# Patient Record
Sex: Female | Born: 1985 | Race: White | Hispanic: No | Marital: Married | Smoking: Never smoker
Health system: Southern US, Community
[De-identification: ages and names within clinical notes are randomized; demographics above are authoritative.]

## PROBLEM LIST (undated history)

## (undated) DIAGNOSIS — N83209 Unspecified ovarian cyst, unspecified side: Secondary | ICD-10-CM

## (undated) DIAGNOSIS — J189 Pneumonia, unspecified organism: Secondary | ICD-10-CM

## (undated) DIAGNOSIS — Q433 Congenital malformations of intestinal fixation: Secondary | ICD-10-CM

## (undated) DIAGNOSIS — IMO0002 Reserved for concepts with insufficient information to code with codable children: Secondary | ICD-10-CM

## (undated) DIAGNOSIS — N189 Chronic kidney disease, unspecified: Secondary | ICD-10-CM

## (undated) DIAGNOSIS — R000 Tachycardia, unspecified: Secondary | ICD-10-CM

## (undated) DIAGNOSIS — E039 Hypothyroidism, unspecified: Secondary | ICD-10-CM

## (undated) DIAGNOSIS — N390 Urinary tract infection, site not specified: Secondary | ICD-10-CM

## (undated) DIAGNOSIS — I499 Cardiac arrhythmia, unspecified: Secondary | ICD-10-CM

## (undated) DIAGNOSIS — R51 Headache: Secondary | ICD-10-CM

## (undated) HISTORY — PX: APPENDECTOMY: SHX54

---
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
## 2007-09-09 HISTORY — PX: LITHOTRIPSY: SUR834

## 2009-09-08 HISTORY — PX: OTHER SURGICAL HISTORY: SHX169

## 2011-09-03 ENCOUNTER — Inpatient Hospital Stay (HOSPITAL_COMMUNITY)
Admission: AD | Admit: 2011-09-03 | Discharge: 2011-09-03 | Disposition: A | Discharge status: Home / Self Care | Source: Ambulatory Visit | Attending: Family Medicine | Admitting: Family Medicine

## 2011-09-03 ENCOUNTER — Encounter (HOSPITAL_COMMUNITY): Payer: Self-pay | Admitting: *Deleted

## 2011-09-03 DIAGNOSIS — J069 Acute upper respiratory infection, unspecified: Secondary | ICD-10-CM

## 2011-09-03 DIAGNOSIS — O99891 Other specified diseases and conditions complicating pregnancy: Secondary | ICD-10-CM | POA: Insufficient documentation

## 2011-09-03 HISTORY — DX: Pneumonia, unspecified organism: J18.9

## 2011-09-03 HISTORY — DX: Tachycardia, unspecified: R00.0

## 2011-09-03 HISTORY — DX: Urinary tract infection, site not specified: N39.0

## 2011-09-03 HISTORY — DX: Reserved for concepts with insufficient information to code with codable children: IMO0002

## 2011-09-03 HISTORY — DX: Chronic kidney disease, unspecified: N18.9

## 2011-09-03 HISTORY — DX: Cardiac arrhythmia, unspecified: I49.9

## 2011-09-03 HISTORY — DX: Headache: R51

## 2011-09-03 HISTORY — DX: Hypothyroidism, unspecified: E03.9

## 2011-09-03 HISTORY — DX: Congenital malformations of intestinal fixation: Q43.3

## 2011-09-03 HISTORY — DX: Unspecified ovarian cyst, unspecified side: N83.209

## 2011-09-03 NOTE — ED Provider Notes (Signed)
Agree with above 

## 2011-09-03 NOTE — ED Provider Notes (Signed)
History     Chief Complaint  Patient presents with  . Cough   HPI HPI Lauren Dunlap 25 y.o. female  G2P0010 at [redacted]w[redacted]d presenting with congestion. Patient lives in [place] but was here for Christmas.   Patient reports ear fullness, sinus congestion, rhinorrhea x 2 days. This morning she noted a new onset of cough and she said she had some burning in her throat and chest with it. No fevers/chills. Slight body aches. No shortness of breath.  Of note, patient hospitalized in September for pneumonia x 1 week with high fevers to 104. She is very concerned about this developing into a pneumonia so wanted to be seen quickly.   Patient denies contractions/decreased fetal movement/discharge/blood from vagina/rush of fluid.   OB History    Grav Para Term Preterm Abortions TAB SAB Ect Mult Living   2    1  1    0      Past Medical History  Diagnosis Date  . Asthma   . Chronic kidney disease     kidney stones  . Malrotation of intestine   . Headache     migraines  . Dysrhythmia   . Tachycardia   . Hypothyroid   . Urinary tract infection   . Pneumonia fall 2012  . Abnormal Pap smear   . Ovarian cyst    Past Surgical History  Procedure Date  . Malrotation of intestine 2011    surgical repair.  Marland Kitchen Appendectomy   . Lithotripsy 2009    Family History  Problem Relation Age of Onset  . Diabetes Maternal Grandmother   . Diabetes Maternal Grandfather   . Anesthesia problems Neg Hx   Thyroiditis in mother  Social history: lives with husband in [place].   History  Substance Use Topics  . Smoking status: Not on file  . Smokeless tobacco: Not on file  . Alcohol Use: Not on file    Allergies:  Allergies  Allergen Reactions  . Codeine Nausea And Vomiting  . Biaxin Rash  . Ceclor (Cefaclor) Rash  . Cefzil Rash  . Erythromycin Rash  . Kiwi Extract Rash    Prescriptions prior to admission  Medication Sig Dispense Refill  . Ascorbic Acid (VITAMIN C) 100 MG tablet Take 100  mg by mouth daily.        . Prenatal Vit-Fe Fumarate-FA (PRENATAL MULTIVITAMIN) TABS Take 1 tablet by mouth daily.        . Pseudoephedrine HCl (SUDAFED 12 HOUR PO) Take 1 tablet by mouth daily.        Marland Kitchen senna (SENOKOT) 8.6 MG tablet Take 1 tablet by mouth daily.        . sodium chloride (OCEAN) 0.65 % nasal spray Place 1 spray into the nose as needed.          ROS negative except as noted in HPI   Physical Exam   Blood pressure 115/58, pulse 110, temperature 98.9 F (37.2 C), resp. rate 18, height 5\' 3"  (1.6 m), weight 79.833 kg (176 lb), SpO2 99.00%.  Physical Exam  HENT:  Right Ear: Tympanic membrane and ear canal normal.  Left Ear: Tympanic membrane and ear canal normal.  Mouth/Throat: Oropharynx is clear and moist and mucous membranes are normal.  Cardiovascular: Regular rhythm.  Exam reveals no gallop and no friction rub.   No murmur heard.      Slightly tachycardic  Respiratory: Effort normal and breath sounds normal. No respiratory distress. She has no wheezes. She has  no rales. She exhibits no tenderness.       No tactile fremitus. No significant percussion findings. No use of accessory muscles.   GI:       Gravid. Size consistent with dates.    FHT -120 baseline. Reactive with 15x15 accels. No decels. Moderate variability  MAU Course  Procedures  MDM o2 sats 99%, no distress/sob/increased work of breathing/fever. Do not suspect pneumonia.   Assessment and Plan  #1 G2P0010 at [redacted]w[redacted]d #2 Viral URI #3 reassuring FHT  WIll discharge home. Encourage PO hydration and rest. Told patient she can continue taking OTC cold medicines that she discussed with her OB at home. She has follow up with her OB tomorrow. Told patient to return if any fever or shortness of breath or any other sings of worsening of her current symptoms.   Case discussed with Wynelle Bourgeois, CNM  HUNTER, STEPHEN 09/03/2011, 7:31 AM

## 2011-09-03 NOTE — Progress Notes (Signed)
Pt reports cough, congestion, sinus pressure x 2 days, taking sudafed without relief. Today began having pain and congestion in her chest, hospitalized for pneumonia in sept.

## 2014-07-10 ENCOUNTER — Encounter (HOSPITAL_COMMUNITY): Payer: Self-pay | Admitting: *Deleted

## 2015-07-27 ENCOUNTER — Other Ambulatory Visit: Payer: Self-pay | Admitting: Gastroenterology

## 2015-07-27 DIAGNOSIS — R110 Nausea: Secondary | ICD-10-CM

## 2015-08-03 ENCOUNTER — Ambulatory Visit (HOSPITAL_COMMUNITY)
Admission: RE | Admit: 2015-08-03 | Discharge: 2015-08-03 | Disposition: A | Discharge status: Home / Self Care | Source: Ambulatory Visit | Attending: Gastroenterology | Admitting: Gastroenterology

## 2015-08-03 DIAGNOSIS — R110 Nausea: Secondary | ICD-10-CM | POA: Insufficient documentation

## 2015-08-15 ENCOUNTER — Ambulatory Visit (HOSPITAL_COMMUNITY)

## 2015-08-15 ENCOUNTER — Encounter (HOSPITAL_COMMUNITY)

## 2015-08-31 ENCOUNTER — Ambulatory Visit (HOSPITAL_COMMUNITY)

## 2015-09-07 ENCOUNTER — Ambulatory Visit (HOSPITAL_COMMUNITY): Admission: RE | Admit: 2015-09-07 | Source: Ambulatory Visit

## 2015-09-12 ENCOUNTER — Inpatient Hospital Stay (HOSPITAL_COMMUNITY)
Admission: AD | Admit: 2015-09-12 | Discharge: 2015-09-12 | Disposition: A | Discharge status: Home / Self Care | Source: Ambulatory Visit | Attending: Family Medicine | Admitting: Family Medicine

## 2015-09-12 ENCOUNTER — Encounter (HOSPITAL_COMMUNITY): Payer: Self-pay | Admitting: *Deleted

## 2015-09-12 DIAGNOSIS — N189 Chronic kidney disease, unspecified: Secondary | ICD-10-CM | POA: Insufficient documentation

## 2015-09-12 DIAGNOSIS — N6310 Unspecified lump in the right breast, unspecified quadrant: Secondary | ICD-10-CM

## 2015-09-12 DIAGNOSIS — N644 Mastodynia: Secondary | ICD-10-CM

## 2015-09-12 DIAGNOSIS — J45909 Unspecified asthma, uncomplicated: Secondary | ICD-10-CM | POA: Insufficient documentation

## 2015-09-12 DIAGNOSIS — N6011 Diffuse cystic mastopathy of right breast: Secondary | ICD-10-CM

## 2015-09-12 DIAGNOSIS — N61 Mastitis without abscess: Secondary | ICD-10-CM | POA: Insufficient documentation

## 2015-09-12 DIAGNOSIS — E039 Hypothyroidism, unspecified: Secondary | ICD-10-CM | POA: Diagnosis not present

## 2015-09-12 LAB — POCT PREGNANCY, URINE: Preg Test, Ur: NEGATIVE

## 2015-09-12 NOTE — MAU Note (Signed)
Having really bad pain in upper rt breast.- since Sept.  Saw dr, found a lump.  Had US- all was clear.  Was put on rx for ? Mastitis.  Pain came back the beginning of Dec.  Pain has gotten worse.  Can't see OB until Jan 10.  The other night breast felt like it was on fire.  Noted an enlarged lymph node, which itches.  Rt upper breast feels full. Pain is now waking her in the night.  Is still breast feeding. Has not had a mammogram

## 2015-09-12 NOTE — MAU Note (Signed)
Urine sent to lab 

## 2015-09-12 NOTE — Discharge Instructions (Signed)
Fibrocystic Breast Changes °Fibrocystic breast changes occur when breast ducts become blocked, causing painful, fluid-filled lumps (cysts) to form in the breast. This is a common condition that is noncancerous (benign). It occurs when women go through hormonal changes during their menstrual cycle. Fibrocystic breast changes can affect one or both breasts. °CAUSES  °The exact cause of fibrocystic breast changes is not known, but it may be related to the female hormones estrogen and progesterone. Family traits that get passed from parent to child (genetics) may also be a factor in some cases. °SIGNS AND SYMPTOMS  °· Tenderness, mild discomfort, or pain.   °· Swelling.   °· Rope-like feeling when touching the breast.   °· Lumpy breast, one or both sides.   °· Changes in breast size, especially before (larger) and after (smaller) the menstrual period.   °· Green or dark brown nipple discharge (not blood).   °Symptoms are usually worse before menstrual periods start and get better toward the end of the menstrual period.  °DIAGNOSIS  °To make a diagnosis, your health care provider will ask you questions and perform a physical exam of your breasts. The health care provider may recommend other tests that can examine inside your breasts, such as: °· A breast X-ray (mammogram).   °· Ultrasonography.  °· An MRI.   °If something more than fibrocystic breast changes is suspected, your health care provider may take a breast tissue sample (breast biopsy) to examine. °TREATMENT  °Often, treatment is not needed. Your health care provider may recommend over-the-counter pain relievers to help lessen pain or discomfort caused by the fibrocystic breast changes. You may also be asked to change your diet to limit or stop eating foods or drinking beverages that contain caffeine. Foods and beverages that contain caffeine include chocolate, soda, coffee, and tea. Reducing sugar and fat in your diet may also help. Your health care provider  may also recommend: °· Fine needle aspiration to remove fluid from a cyst that is causing pain.   °· Surgery to remove a large, persistent, and tender cyst. °HOME CARE INSTRUCTIONS  °· Examine your breasts after every menstrual period. If you do not have menstrual periods, check your breasts the first day of every month. Feel for changes, such as more tenderness, a new growth, a change in breast size, or a change in a lump that has always been there.   °· Only take over-the-counter or prescription medicine as directed by your health care provider.   °· Wear a well-fitted support or sports bra, especially when exercising.   °· Decrease or avoid caffeine, fat, and sugar in your diet as directed by your health care provider.   °SEEK MEDICAL CARE IF:  °· You have fluid leaking (discharge) from your nipples, especially bloody discharge.   °· You have new lumps or bumps in the breast.   °· Your breast or breasts become enlarged, red, and painful.   °· You have areas of your breast that pucker in.   °· Your nipples appear flat or indented.   °  °This information is not intended to replace advice given to you by your health care provider. Make sure you discuss any questions you have with your health care provider. °  °Document Released: 06/11/2006 Document Revised: 05/16/2015 Document Reviewed: 02/13/2013 °Elsevier Interactive Patient Education ©2016 [employer] ° ° °

## 2015-09-12 NOTE — MAU Provider Note (Signed)
This is a Consulting civil engineer note.   See my complete note on the same date.    Sharen Counter, CNM 6:06 PM    History     CSN: [id number]  Arrival date and time: 09/12/15 1059   First Provider Initiated Contact with Patient 09/12/15 1417      Chief Complaint  Patient presents with  . Breast Pain   HPI  Patient is a 30 year old female who presents with breast pain. Patient has been breastfeeding for the past 17 months. In in Sept/Oct of this year she began weaning her son and developed right breast pain characterized as a dull ache and associated with a lump.  She was assessed by her PCP and dx with mastitis.  She also had an ultrasound done during this time which was non-revealing.  Initially her symptoms subsided, but in December returned with more intense pain. Patient states she made another appointment with her PCP with plans to be seen January 10th, however, over the past few days she has developed burning, itching, and a new lump near her axilla which concerned her prompting her to seek emergent care.  Denies rash, redness, swelling.  She has been able to continue breastfeeding.  OB History    Gravida Para Term Preterm AB TAB SAB Ectopic Multiple Living   3    1  1   2       Past Medical History  Diagnosis Date  . Asthma   . Chronic kidney disease     kidney stones  . Malrotation of intestine   . Headache(784.0)     migraines  . Dysrhythmia   . Tachycardia   . Hypothyroid   . Urinary tract infection   . Pneumonia fall 2012  . Abnormal Pap smear   . Ovarian cyst     Past Surgical History  Procedure Laterality Date  . Malrotation of intestine  2011    surgical repair.  Marland Kitchen Appendectomy    . Lithotripsy  2009    Family History  Problem Relation Age of Onset  . Diabetes Maternal Grandmother   . Diabetes Maternal Grandfather   . Anesthesia problems Neg Hx     Social History  Substance Use Topics  . Smoking status: Never Smoker   . Smokeless tobacco: Never Used   . Alcohol Use: No    Allergies:  Allergies  Allergen Reactions  . Avelox [Moxifloxacin Hcl In Nacl] Anaphylaxis  . Codeine Nausea And Vomiting  . Ketorolac Tromethamine     "made her loopy", heart racing.  Was told to list.  . Morphine And Related     halucination  . Ceclor [Cefaclor] Rash  . Cefprozil Rash  . Clarithromycin Rash  . Erythromycin Rash  . Kiwi Extract Rash    Prescriptions prior to admission  Medication Sig Dispense Refill Last Dose  . Cholecalciferol (VITAMIN D3) 5000 units CAPS Take 1 capsule by mouth daily.   09/11/2015 at Unknown time  . levalbuterol (XOPENEX HFA) 45 MCG/ACT inhaler Inhale 1-2 puffs into the lungs every 4 (four) hours as needed for wheezing.     Marland Kitchen loratadine (CLARITIN) 10 MG tablet Take 10 mg by mouth daily.   09/11/2015 at Unknown time  . omeprazole (PRILOSEC) 20 MG capsule Take 20 mg by mouth daily.   09/11/2015 at Unknown time  . Prenatal Vit-Fe Fumarate-FA (PRENATAL MULTIVITAMIN) TABS Take 1 tablet by mouth daily.     09/11/2015 at Unknown time    Review of Systems  Constitutional: Negative for fever and chills.  Gastrointestinal: Negative for nausea.  Musculoskeletal: Negative for joint pain and neck pain.  Skin: Positive for itching. Negative for rash.   Physical Exam   Blood pressure 111/65, pulse 81, temperature 98.4 F (36.9 C), temperature source Oral, resp. rate 18, weight 72.122 kg (159 lb), last menstrual period 09/25/2014, unknown if currently breastfeeding.  Physical Exam  Constitutional: She appears well-developed and well-nourished.  HENT:  Head: Normocephalic and atraumatic.  Cardiovascular: Normal rate.   Respiratory: Effort normal. No respiratory distress. Right breast exhibits mass (several rope-like, small, mobile) and tenderness. Right breast exhibits no skin change.    MAU Course  Procedures  MDM Probable fibrocystic changes and tenderness felt on breast exam.  Various densities palpated throughout breast and  into tail of spence.  No lymphadenopathy.  Changes are associated with recent change in breastfeeding practice and menstrual cycle.  No rash, erythema, warmth or other indication of infection at this time.   Pregnancy test negative.   Assessment and Plan  Probable fibrocystic changes to right breast. [facility]. Will follow for results.  Encouraged patient to keep January 10th appointment with PCP or return to MAU if concerns continue.  Reassurance provided.   Loyce DysKacie Matthews 09/12/2015, 2:32 PM   I have seen this patient and agree with the above student's note.  See my provider note on same date.  LEFTWICH-KIRBY, Janiya Millirons Certified Nurse-Midwife

## 2015-12-01 ENCOUNTER — Emergency Department (INDEPENDENT_AMBULATORY_CARE_PROVIDER_SITE_OTHER)
Admission: EM | Admit: 2015-12-01 | Discharge: 2015-12-01 | Disposition: A | Discharge status: Home / Self Care | Source: Home / Self Care | Attending: Family Medicine | Admitting: Family Medicine

## 2015-12-01 ENCOUNTER — Emergency Department (INDEPENDENT_AMBULATORY_CARE_PROVIDER_SITE_OTHER)

## 2015-12-01 ENCOUNTER — Encounter: Payer: Self-pay | Admitting: Emergency Medicine

## 2015-12-01 DIAGNOSIS — R0789 Other chest pain: Secondary | ICD-10-CM | POA: Diagnosis not present

## 2015-12-01 DIAGNOSIS — R0602 Shortness of breath: Secondary | ICD-10-CM | POA: Diagnosis not present

## 2015-12-01 DIAGNOSIS — F410 Panic disorder [episodic paroxysmal anxiety] without agoraphobia: Secondary | ICD-10-CM

## 2015-12-01 DIAGNOSIS — J069 Acute upper respiratory infection, unspecified: Secondary | ICD-10-CM | POA: Diagnosis not present

## 2015-12-01 LAB — POCT FASTING CBG KUC MANUAL ENTRY: POCT Glucose (KUC): 90 mg/dL (ref 70–99)

## 2015-12-01 NOTE — ED Notes (Signed)
Pt c/o chest tightness x 3 days, SOB, lightheadness x today, productive cough.  Pt is currently on Augmentin x 4 days now for an URI.

## 2015-12-01 NOTE — ED Provider Notes (Signed)
CSN: [id number]     Arrival date & time 12/01/15  1134 History   First MD Initiated Contact with Patient 12/01/15 1138     Chief Complaint  Patient presents with  . Chest Pain   (Consider location/radiation/quality/duration/timing/severity/associated sxs/prior Treatment) HPI The pt is a 30yo female [facility] with c/o chest tightness for 3 days with associated SOB and lightheadedness that started today with associated productive cough.  Per CareEverywhere, she [facility] for URI, but symptoms were not improving so her PCP called in Augmentin for her to start taking on 11/26/15.  She is on day 4 of 10 day course of the medication but states she "does not feel right" today.  She reports hx of asthma in the past but has not had to use an inhaler in several months.  Pt states she does not like how albuterol makes her feel.  She also reports having prednisone in the past for asthma but states she does not want it unless she has to due to recent weight gain.  Denies fever, chills, n/v/d.    Past Medical History  Diagnosis Date  . Asthma   . Chronic kidney disease     kidney stones  . Malrotation of intestine   . Headache(784.0)     migraines  . Dysrhythmia   . Tachycardia   . Hypothyroid   . Urinary tract infection   . Pneumonia fall 2012  . Abnormal Pap smear   . Ovarian cyst    Past Surgical History  Procedure Laterality Date  . Malrotation of intestine  2011    surgical repair.  Marland Kitchen. Appendectomy    . Lithotripsy  2009   Family History  Problem Relation Age of Onset  . Diabetes Maternal Grandmother   . Diabetes Maternal Grandfather   . Anesthesia problems Neg Hx    Social History  Substance Use Topics  . Smoking status: Never Smoker   . Smokeless tobacco: Never Used  . Alcohol Use: No   OB History    Gravida Para Term Preterm AB TAB SAB Ectopic Multiple Living   3    1  1   2      Review of Systems  Constitutional: Negative for fever and chills.   HENT: Positive for congestion, rhinorrhea, sinus pressure and sneezing. Negative for ear pain, sore throat, trouble swallowing and voice change.   Respiratory: Positive for cough, chest tightness, shortness of breath and wheezing.   Cardiovascular: Positive for chest pain. Negative for palpitations.  Gastrointestinal: Positive for nausea. Negative for vomiting, abdominal pain and diarrhea.  Musculoskeletal: Negative for myalgias, back pain and arthralgias.  Skin: Negative for rash.  Neurological: Positive for light-headedness and headaches. Negative for dizziness and syncope.  Psychiatric/Behavioral: Negative for agitation. The patient is nervous/anxious.   All other systems reviewed and are negative.   Allergies  Avelox; Codeine; Ketorolac tromethamine; Morphine and related; Ceclor; Cefprozil; Clarithromycin; Erythromycin; and Kiwi extract  Home Medications   Prior to Admission medications   Not on File   Meds Ordered and Administered this Visit  Medications - No data to display  BP 124/80 mmHg  Pulse 70  Temp(Src) 98 F (36.7 C) (Oral)  Ht 5\' 3"  (1.6 m)  Wt 156 lb (70.761 kg)  BMI 27.64 kg/m2  SpO2 100%  LMP 12/01/2015 No data found.   Physical Exam  Constitutional: She is oriented to person, place, and time. She appears well-developed and well-nourished. No distress.  HENT:  Head: Normocephalic and atraumatic.  Right Ear: Tympanic membrane normal.  Left Ear: Tympanic membrane normal.  Nose: Mucosal edema present.  Mouth/Throat: Uvula is midline, oropharynx is clear and moist and mucous membranes are normal.  Eyes: Conjunctivae are normal. No scleral icterus.  Neck: Normal range of motion. Neck supple.  Cardiovascular: Normal rate, regular rhythm and normal heart sounds.   Pulmonary/Chest: Effort normal and breath sounds normal. No stridor. No respiratory distress. She has no wheezes. She has no rales. She exhibits no tenderness.  Lings: CTAB. No wheeze or rhonchi.  Able to speak in full sentences w/o difficulty.   Abdominal: Soft. Bowel sounds are normal. She exhibits no distension and no mass. There is no tenderness. There is no rebound and no guarding.  Musculoskeletal: Normal range of motion.  Lymphadenopathy:    She has no cervical adenopathy.  Neurological: She is alert and oriented to person, place, and time. Coordination normal.  Skin: Skin is warm and dry. She is not diaphoretic.  Nursing note and vitals reviewed.   ED Course  Procedures (including critical care time)  Labs Review Labs Reviewed  POCT FASTING [facility] MANUAL ENTRY    Imaging Review Dg Chest 2 View  12/01/2015  CLINICAL DATA:  Chest tightness for 3 days, shortness of Breath EXAM: CHEST  2 VIEW COMPARISON:  None. FINDINGS: Cardiomediastinal silhouette is unremarkable. No acute infiltrate or pleural effusion. No pulmonary edema. Bony thorax is unremarkable. IMPRESSION: No active cardiopulmonary disease. Electronically Signed   By: Natasha Mead M.D.   On: 12/01/2015 12:31    <ECG> Date/Time:12/01/2015   12:34:16 Ventricular Rate: 84bpm PR Interval: QRS Duration: 84ms QT Interval:  QTC Calculation: 410/477ms R Axis: 68 Text Interpretation: Normal sinus rhythm, Prolonged QT, abnormal EKG No prior EKG to compare.     MDM   1. Panic attack   2. Other chest pain   3. Shortness of breath   4. Chest tightness   5. Acute upper respiratory infection     Pt c/o chest tightness and SOB. Currently on Augmentin for URI symptoms.  Pt's main concern is feeling lightheaded. Denies personal hx of diabetes but reports family hx of DM.  CBG- 90. O2 Sat 100% on RA. Lungs: CTAB. No respiratory distress.  CXR: no active cardiopulmonary disease.  After returning from pt became anxious stating she felt like she was going to pass out.   EKG- Normal sinus rhythm, Prolonged QT.  No prior EKG to compare.  Pt allowed to lie on exam bed, cool washcloth applied to face.  Reassured pt vitals, EKG, and CXR, not concerning for emergent process taking place. Doubt MI or PE.  Encouraged to take slow deep breaths.   Advised pt no anti-anxiety medications available in UC setting.  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  Offered to call EMS.  Pt will ride with father POV to [facility] where additional workup and medications can be given as needed. Pt stable for discharge POV. Father feels comfortable driving his daughter. [facility] notified of pt's pending arrival.     Junius Finner, [place] 12/01/15 1314

## 2018-07-28 ENCOUNTER — Encounter: Payer: Self-pay | Admitting: *Deleted

## 2018-07-28 ENCOUNTER — Emergency Department (INDEPENDENT_AMBULATORY_CARE_PROVIDER_SITE_OTHER)

## 2018-07-28 ENCOUNTER — Emergency Department (INDEPENDENT_AMBULATORY_CARE_PROVIDER_SITE_OTHER)
Admission: EM | Admit: 2018-07-28 | Discharge: 2018-07-28 | Disposition: A | Discharge status: Home / Self Care | Source: Home / Self Care | Attending: Family Medicine | Admitting: Family Medicine

## 2018-07-28 ENCOUNTER — Other Ambulatory Visit: Payer: Self-pay

## 2018-07-28 DIAGNOSIS — J069 Acute upper respiratory infection, unspecified: Secondary | ICD-10-CM

## 2018-07-28 DIAGNOSIS — R062 Wheezing: Secondary | ICD-10-CM

## 2018-07-28 DIAGNOSIS — B9789 Other viral agents as the cause of diseases classified elsewhere: Secondary | ICD-10-CM

## 2018-07-28 DIAGNOSIS — R05 Cough: Secondary | ICD-10-CM | POA: Diagnosis not present

## 2018-07-28 DIAGNOSIS — J040 Acute laryngitis: Secondary | ICD-10-CM

## 2018-07-28 NOTE — Discharge Instructions (Signed)
°  Please follow up with your family doctor in 1 week if not improving.  °

## 2018-07-28 NOTE — ED Triage Notes (Signed)
Pt c/o productive cough and hoarseness x 4 days. Denies fever.

## 2018-07-28 NOTE — ED Provider Notes (Signed)
[facility]    CSN: [id number] Arrival date & time: 07/28/18  0827     History   Chief Complaint Chief Complaint  Patient presents with  . Cough    HPI Lauren Dunlap is a 32 y.o. female.   HPI  Lauren Dunlap is a 32 y.o. female presenting to UC with c/o 4 days of worsening productive cough that started as a scratchy throat and hoarse voice. She has had body aches, which resolved with Tylenol. This morning she could only whisper.  Hx of asthma, bronchitis and pneumonia. Pt also currently breastfeeding her 49mo old.  She wants to make sure she does not need an antibiotic. Denies fever, chills, n/v/d. Mild chest soreness with cough but denies SOB. Her daughter just had croup last week.    Past Medical History:  Diagnosis Date  . Abnormal Pap smear   . Asthma   . Chronic kidney disease    kidney stones  . Dysrhythmia   . Headache(784.0)    migraines  . Hypothyroid   . Malrotation of intestine   . Ovarian cyst   . Pneumonia fall 2012  . Tachycardia   . Urinary tract infection     There are no active problems to display for this patient.   Past Surgical History:  Procedure Laterality Date  . APPENDECTOMY    . LITHOTRIPSY  2009  . malrotation of intestine  2011   surgical repair.    OB History    Gravida  3   Para      Term      Preterm      AB  1   Living  2     SAB  1   TAB      Ectopic      Multiple      Live Births               Home Medications    Prior to Admission medications   Medication Sig Start Date End Date Taking? Authorizing Provider  Ascorbic Acid (VITAMIN C) 100 MG tablet Take 100 mg by mouth daily.   Yes [provider]  beclomethasone (QVAR REDIHALER) 80 MCG/ACT inhaler daily. 01/08/16  Yes [provider]  docusate sodium (COLACE) 100 MG capsule Take 100 mg by mouth 2 (two) times daily.   Yes [provider]  ELDERBERRY PO Take by mouth.   Yes [provider]    esomeprazole (NEXIUM) 40 MG capsule Take by mouth. 11/09/17  Yes [provider]  levalbuterol Pauline Aus(XOPENEX HFA) 45 MCG/ACT inhaler Inhale into the lungs. 12/01/15  Yes [provider]  Cholecalciferol (VITAMIN D3) 50 MCG (2000 UT) capsule Take by mouth.    [provider]    Family History Family History  Problem Relation Age of Onset  . Diabetes Maternal Grandmother   . Diabetes Maternal Grandfather   . Anesthesia problems Neg Hx     Social History Social History   Tobacco Use  . Smoking status: Never Smoker  . Smokeless tobacco: Never Used  Substance Use Topics  . Alcohol use: Yes    Comment: rarely  . Drug use: No     Allergies   Avelox [moxifloxacin hcl in nacl]; Codeine; Doxycycline; Ketorolac tromethamine; Morphine and related; Ceclor [cefaclor]; Cefprozil; Clarithromycin; Erythromycin; and Kiwi extract   Review of Systems Review of Systems  Constitutional: Negative for chills and fever.  HENT: Positive for congestion, sore throat and voice change. Negative for ear pain  and trouble swallowing.   Respiratory: Positive for cough. Negative for shortness of breath.   Cardiovascular: Negative for chest pain and palpitations.  Gastrointestinal: Negative for abdominal pain, diarrhea, nausea and vomiting.  Musculoskeletal: Positive for myalgias. Negative for arthralgias and back pain.  Skin: Negative for rash.     Physical Exam Triage Vital Signs ED Triage Vitals  Enc Vitals Group     BP 07/28/18 0844 119/75     Pulse Rate 07/28/18 0844 81     Resp 07/28/18 0844 18     Temp 07/28/18 0844 98.2 F (36.8 C)     Temp Source 07/28/18 0844 Oral     SpO2 07/28/18 0844 97 %     Weight 07/28/18 0845 167 lb (75.8 kg)     Height 07/28/18 0845 5\' 3"  (1.6 m)     Head Circumference --      Peak Flow --      Pain Score 07/28/18 0845 0     Pain Loc --      Pain Edu? --      Excl. in GC? --    No data found.  Updated Vital Signs BP 119/75 (BP  Location: Right Arm)   Pulse 81   Temp 98.2 F (36.8 C) (Oral)   Resp 18   Ht 5\' 3"  (1.6 m)   Wt 167 lb (75.8 kg)   SpO2 97%   BMI 29.58 kg/m   Visual Acuity Right Eye Distance:   Left Eye Distance:   Bilateral Distance:    Right Eye Near:   Left Eye Near:    Bilateral Near:     Physical Exam  Constitutional: She is oriented to person, place, and time. She appears well-developed and well-nourished. No distress.  HENT:  Head: Normocephalic and atraumatic.  Right Ear: Tympanic membrane normal.  Left Ear: Tympanic membrane normal.  Nose: Nose normal. Right sinus exhibits no maxillary sinus tenderness and no frontal sinus tenderness. Left sinus exhibits no maxillary sinus tenderness and no frontal sinus tenderness.  Mouth/Throat: Uvula is midline, oropharynx is clear and moist and mucous membranes are normal.  Eyes: EOM are normal.  Neck: Normal range of motion. Neck supple.  Cardiovascular: Normal rate and regular rhythm.  Pulmonary/Chest: Effort normal. No stridor. No respiratory distress. She has wheezes.  Faint expiratory wheeze in upper lung fields. No respiratory distress.   Hoarse voice but no stridor.   Musculoskeletal: Normal range of motion.  Neurological: She is alert and oriented to person, place, and time.  Skin: Skin is warm and dry. She is not diaphoretic.  Psychiatric: She has a normal mood and affect. Her behavior is normal.  Nursing note and vitals reviewed.    UC Treatments / Results  Labs (all labs ordered are listed, but only abnormal results are displayed) Labs Reviewed - No data to display  EKG None  Radiology Dg Chest 2 View  Result Date: 07/28/2018 CLINICAL DATA:  Cough, congestion, wheezing EXAM: CHEST - 2 VIEW COMPARISON:  12/01/2015 FINDINGS: Lungs are clear.  No pleural effusion or pneumothorax. The heart is normal in size. Visualized osseous structures are within normal limits. IMPRESSION: Normal chest radiographs. Electronically Signed    By: Charline Bills M.D.   On: 07/28/2018 09:14    Procedures Procedures (including critical care time)  Medications Ordered in UC Medications - No data to display  Initial Impression / Assessment and Plan / UC Course  I have reviewed the triage vital signs and the nursing notes.  Pertinent labs & imaging results that were available during my care of the patient were reviewed by me and considered in my medical decision making (see chart for details).     Hx and exam c/w viral illness No antibiotics indicated at this time. Pt is breastfeeding her 30mo old daughter, will hold off on prednisone at this time as O2 Sat is 97% on RA, no respiratory distress. Encouraged symptomatic tx.   Final Clinical Impressions(s) / UC Diagnoses   Final diagnoses:  Viral URI with cough  Laryngitis     Discharge Instructions      Please follow up with your family doctor in 1 week if not improving.     ED Prescriptions    None     Controlled Substance Prescriptions [place] Controlled Substance Registry consulted? Not Applicable   Rolla Plate 07/28/18 1610

## 2019-10-29 IMAGING — DX DG CHEST 2V
2 series · 2 of 2 positions shown · non-contrast
Comparison: 12/01/2015

CLINICAL DATA: Cough, congestion, wheezing

EXAM:
CHEST - 2 VIEW

[chest pa]
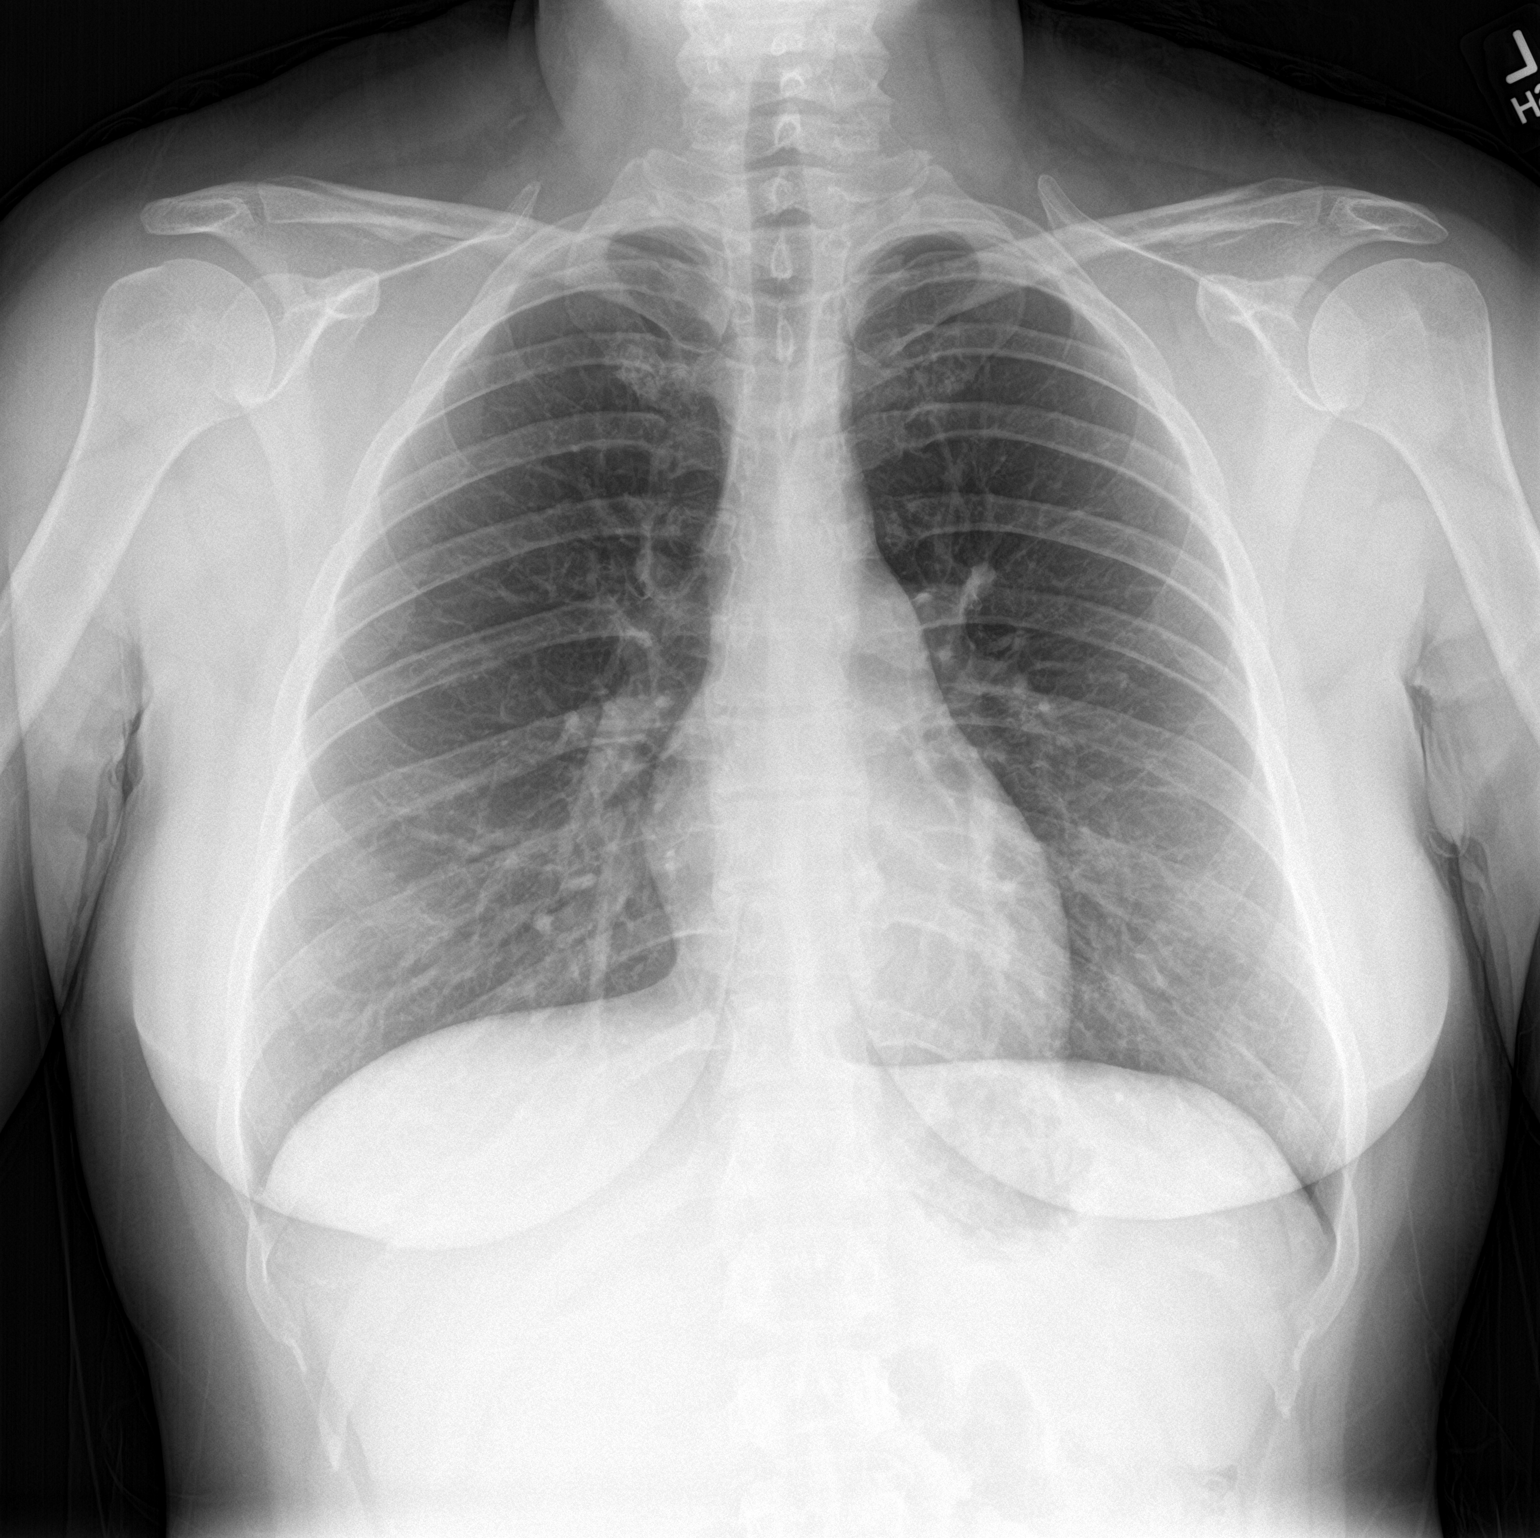

[chest lat]
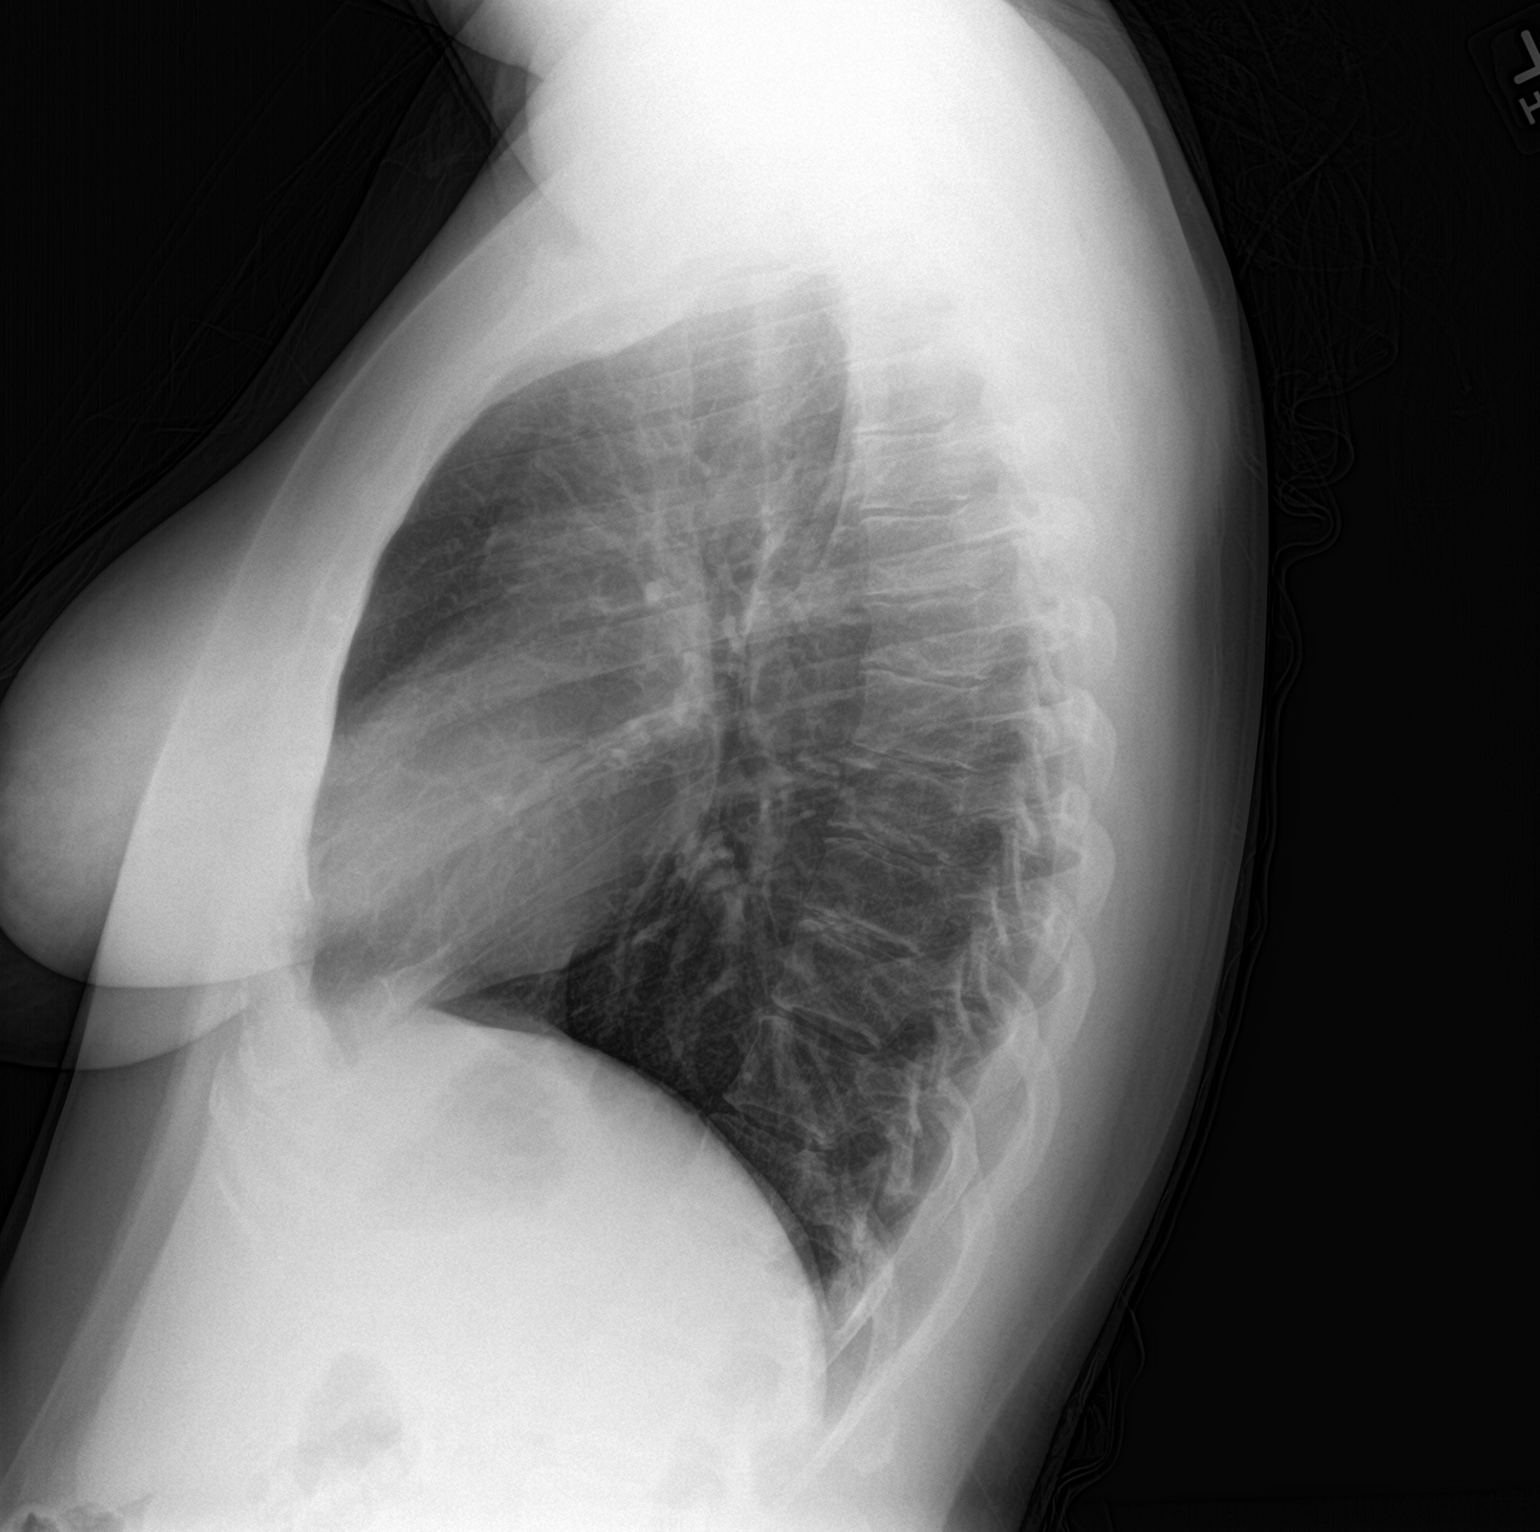

[2 of 2 positions shown; findings below may reference images not displayed]

FINDINGS: Lungs are clear.  No pleural effusion or pneumothorax.

The heart is normal in size.

Visualized osseous structures are within normal limits.
IMPRESSION: Normal chest radiographs.
